# Patient Record
Sex: Female | Born: 1990 | Race: Black or African American | Hispanic: No | Marital: Married | Smoking: Former smoker
Health system: Southern US, Community
[De-identification: ages and names within clinical notes are randomized; demographics above are authoritative.]

## PROBLEM LIST (undated history)

## (undated) DIAGNOSIS — B9689 Other specified bacterial agents as the cause of diseases classified elsewhere: Secondary | ICD-10-CM

## (undated) DIAGNOSIS — J40 Bronchitis, not specified as acute or chronic: Secondary | ICD-10-CM

## (undated) HISTORY — PX: TUBAL LIGATION: SHX77

## (undated) HISTORY — PX: CHOLECYSTECTOMY: SHX55

---

## 2018-04-10 ENCOUNTER — Emergency Department (HOSPITAL_COMMUNITY): Payer: Medicare Other

## 2018-04-10 ENCOUNTER — Emergency Department (HOSPITAL_COMMUNITY)
Admission: EM | Admit: 2018-04-10 | Discharge: 2018-04-10 | Disposition: A | Discharge status: Home / Self Care | Payer: Medicare Other | Attending: Emergency Medicine | Admitting: Emergency Medicine

## 2018-04-10 ENCOUNTER — Encounter (HOSPITAL_COMMUNITY): Payer: Self-pay | Admitting: Emergency Medicine

## 2018-04-10 ENCOUNTER — Other Ambulatory Visit: Payer: Self-pay

## 2018-04-10 DIAGNOSIS — R05 Cough: Secondary | ICD-10-CM | POA: Diagnosis not present

## 2018-04-10 DIAGNOSIS — F17210 Nicotine dependence, cigarettes, uncomplicated: Secondary | ICD-10-CM | POA: Diagnosis not present

## 2018-04-10 DIAGNOSIS — R059 Cough, unspecified: Secondary | ICD-10-CM

## 2018-04-10 MED ORDER — ACETAMINOPHEN 500 MG PO TABS
1000.0000 mg | ORAL_TABLET | Freq: Once | ORAL | Status: AC
  Administered 2018-04-10: 1000 mg via ORAL
  Filled 2018-04-10: qty 2

## 2018-04-10 NOTE — ED Triage Notes (Signed)
Cough/congestion/fever/bodyaches for past few days. Daughter been sick with similar.

## 2018-04-10 NOTE — ED Provider Notes (Signed)
[facility] EMERGENCY DEPARTMENT Provider Note   CSN: [id number] Arrival date & time: 04/10/18  1749     History   Chief Complaint Chief Complaint  Patient presents with  . Cough    HPI Tina Gross is a 27 y.o. female who presents today for valuation of cough for 2 days with body aches, and chest pain.  She reports that her chest started hurting only today and she feels like that is from coughing, primarily hurts in the front.  She reports nasal congestion.  She has not had any ibuprofen or Tylenol, feels warm however denies temperatures over 100.4.  HPI  History reviewed. No pertinent past medical history.  There are no active problems to display for this patient.   Past Surgical History:  Procedure Laterality Date  . CHOLECYSTECTOMY       OB History   None      Home Medications    Prior to Admission medications   Not on File    Family History History reviewed. No pertinent family history.  Social History Social History   Tobacco Use  . Smoking status: Current Every Day Smoker    Packs/day: 0.50    Types: Cigarettes  . Smokeless tobacco: Never Used  Substance Use Topics  . Alcohol use: Never    Frequency: Never  . Drug use: Not on file     Allergies   Dilaudid [hydromorphone] and Keflex [cephalexin]   Review of Systems Review of Systems  Constitutional: Negative for chills and fever.  HENT: Positive for congestion, ear pain, postnasal drip, rhinorrhea and sore throat. Negative for sinus pressure, sinus pain, trouble swallowing and voice change.   Respiratory: Positive for cough. Negative for chest tightness and shortness of breath.   Cardiovascular: Positive for chest pain. Negative for palpitations.  Musculoskeletal: Negative for neck pain and neck stiffness.  Neurological: Negative for headaches.     Physical Exam Updated Vital Signs BP 105/67 (BP Location: Right Arm)   Pulse 86   Temp 99.2 F (37.3 C) (Oral)   Resp 20   Ht 5'  7" (1.702 m)   Wt 104.3 kg   LMP 03/29/2018   SpO2 100%   BMI 36.02 kg/m   Physical Exam  Constitutional: She appears well-developed and well-nourished. No distress.  HENT:  Head: Normocephalic and atraumatic.  Right Ear: Tympanic membrane, external ear and ear canal normal.  Left Ear: Tympanic membrane, external ear and ear canal normal.  Nose: Mucosal edema and rhinorrhea present.  Mouth/Throat: Uvula is midline, oropharynx is clear and moist and mucous membranes are normal. No oropharyngeal exudate. No tonsillar exudate.  Eyes: Conjunctivae are normal. No scleral icterus.  Neck: Normal range of motion. Neck supple.  Cardiovascular: Normal rate and regular rhythm.  Pulmonary/Chest: Effort normal and breath sounds normal. No respiratory distress. She has no wheezes. She exhibits tenderness (Anteriorly over the sternocostal joints.  Palpation in this area both recreates and exacerbates her pain.  ).  Lymphadenopathy:    She has no cervical adenopathy.  Neurological: She is alert.  Skin: Skin is warm and dry. She is not diaphoretic.  Psychiatric: She has a normal mood and affect. Her behavior is normal.  Nursing note and vitals reviewed.    ED Treatments / Results  Labs (all labs ordered are listed, but only abnormal results are displayed) Labs Reviewed - No data to display  EKG EKG Interpretation  Date/Time:  Wednesday April 10 2018 18:21:59 EDT Ventricular Rate:  93 PR  Interval:  170 QRS Duration: 74 QT Interval:  352 QTC Calculation: 437 R Axis:   69 Text Interpretation:  Normal sinus rhythm Normal ECG Confirmed by Bethann Berkshire [phone]) on 04/10/2018 6:56:42 PM   Radiology Dg Chest 2 View  Result Date: 04/10/2018 CLINICAL DATA:  Cough with fever EXAM: CHEST - 2 VIEW COMPARISON:  None. FINDINGS: Increased interstitial opacity. No focal consolidation or effusion. Normal heart size. No pneumothorax IMPRESSION: Increased interstitial opacity, possible bronchial  inflammation or viral process. No focal pneumonia. Electronically Signed   By: Jasmine Pang M.D.   On: 04/10/2018 18:52    Procedures Procedures (including critical care time)  Medications Ordered in ED Medications  acetaminophen (TYLENOL) tablet 1,000 mg (1,000 mg Oral Given 04/10/18 1847)     Initial Impression / Assessment and Plan / ED Course  I have reviewed the triage vital signs and the nursing notes.  Pertinent labs & imaging results that were available during my care of the patient were reviewed by me and considered in my medical decision making (see chart for details).    Pt CXR negative for acute infiltrate. Patients symptoms are consistent with URI, likely viral etiology. Discussed that antibiotics are not indicated for viral infections.  She did report chest pain, EKG was obtained without acute abnormalities, I suspect that her chest pain is musculoskeletal in nature as it is re-created with palpation of anterior chest wall.  Pt will be discharged with symptomatic treatment.  Verbalizes understanding and is agreeable with plan. Pt is hemodynamically stable & in NAD prior to dc.    Final Clinical Impressions(s) / ED Diagnoses   Final diagnoses:  Cough    ED Discharge Orders    None       Norman Clay 04/10/18 2304    Bethann Berkshire, MD 04/10/18 2336

## 2018-04-10 NOTE — Discharge Instructions (Signed)

## 2020-09-10 ENCOUNTER — Encounter (HOSPITAL_COMMUNITY): Payer: Self-pay

## 2020-09-10 ENCOUNTER — Other Ambulatory Visit: Payer: Self-pay

## 2020-09-10 DIAGNOSIS — Z881 Allergy status to other antibiotic agents status: Secondary | ICD-10-CM | POA: Insufficient documentation

## 2020-09-10 DIAGNOSIS — M7989 Other specified soft tissue disorders: Secondary | ICD-10-CM | POA: Insufficient documentation

## 2020-09-10 DIAGNOSIS — R0981 Nasal congestion: Secondary | ICD-10-CM | POA: Diagnosis not present

## 2020-09-10 DIAGNOSIS — R2241 Localized swelling, mass and lump, right lower limb: Secondary | ICD-10-CM | POA: Diagnosis not present

## 2020-09-10 DIAGNOSIS — J029 Acute pharyngitis, unspecified: Secondary | ICD-10-CM | POA: Insufficient documentation

## 2020-09-10 DIAGNOSIS — Z885 Allergy status to narcotic agent status: Secondary | ICD-10-CM | POA: Diagnosis not present

## 2020-09-10 DIAGNOSIS — R110 Nausea: Secondary | ICD-10-CM | POA: Diagnosis not present

## 2020-09-10 DIAGNOSIS — Z20822 Contact with and (suspected) exposure to covid-19: Secondary | ICD-10-CM | POA: Diagnosis not present

## 2020-09-10 DIAGNOSIS — R6883 Chills (without fever): Secondary | ICD-10-CM | POA: Insufficient documentation

## 2020-09-10 DIAGNOSIS — Z87891 Personal history of nicotine dependence: Secondary | ICD-10-CM | POA: Diagnosis not present

## 2020-09-10 NOTE — ED Notes (Signed)
Pt reports being sent home early from work yesterday as well for feeling nauseated with 1 episode of emesis. Pt reports not being vaccinated and not being allowed to return to work without being tested for Covid. Pt reports being able to tolerate PO fluids but endorses uneasy feeling in her stomach.

## 2020-09-10 NOTE — ED Triage Notes (Signed)
Pt reports lower right leg swelling which began last night. Pt reports pain and swelling came on suddenly and denies injury to extremity. Pt ambulatory in Triage.

## 2020-09-11 ENCOUNTER — Emergency Department (HOSPITAL_COMMUNITY)
Admission: EM | Admit: 2020-09-11 | Discharge: 2020-09-11 | Disposition: A | Discharge status: Home / Self Care | Payer: BC Managed Care – PPO | Attending: Emergency Medicine | Admitting: Emergency Medicine

## 2020-09-11 ENCOUNTER — Ambulatory Visit (HOSPITAL_COMMUNITY)
Admission: RE | Admit: 2020-09-11 | Discharge: 2020-09-11 | Disposition: A | Discharge status: Home / Self Care | Payer: BC Managed Care – PPO | Source: Ambulatory Visit | Attending: Emergency Medicine | Admitting: Emergency Medicine

## 2020-09-11 DIAGNOSIS — R0981 Nasal congestion: Secondary | ICD-10-CM

## 2020-09-11 DIAGNOSIS — M7989 Other specified soft tissue disorders: Secondary | ICD-10-CM

## 2020-09-11 DIAGNOSIS — R2241 Localized swelling, mass and lump, right lower limb: Secondary | ICD-10-CM | POA: Diagnosis not present

## 2020-09-11 DIAGNOSIS — M79604 Pain in right leg: Secondary | ICD-10-CM | POA: Insufficient documentation

## 2020-09-11 LAB — I-STAT CHEM 8, ED
BUN: 11 mg/dL (ref 6–20)
Calcium, Ion: 1.22 mmol/L (ref 1.15–1.40)
Chloride: 106 mmol/L (ref 98–111)
Creatinine, Ser: 0.7 mg/dL (ref 0.44–1.00)
Glucose, Bld: 97 mg/dL (ref 70–99)
HCT: 35 % — ABNORMAL LOW (ref 36.0–46.0)
Hemoglobin: 11.9 g/dL — ABNORMAL LOW (ref 12.0–15.0)
Potassium: 4.3 mmol/L (ref 3.5–5.1)
Sodium: 139 mmol/L (ref 135–145)
TCO2: 26 mmol/L (ref 22–32)

## 2020-09-11 LAB — I-STAT BETA HCG BLOOD, ED (MC, WL, AP ONLY): I-stat hCG, quantitative: <5 m[IU]/mL (ref <5)

## 2020-09-11 LAB — SARS CORONAVIRUS 2 (TAT 6-24 HRS): SARS Coronavirus 2: NEGATIVE

## 2020-09-11 MED ORDER — HYDROCODONE-ACETAMINOPHEN 5-325 MG PO TABS
1.0000 | ORAL_TABLET | Freq: Once | ORAL | Status: AC
  Administered 2020-09-11: 1 via ORAL
  Filled 2020-09-11: qty 1

## 2020-09-11 MED ORDER — IBUPROFEN 400 MG PO TABS
400.0000 mg | ORAL_TABLET | Freq: Once | ORAL | Status: AC
  Administered 2020-09-11: 400 mg via ORAL
  Filled 2020-09-11: qty 1

## 2020-09-11 MED ORDER — ENOXAPARIN SODIUM 120 MG/0.8ML ~~LOC~~ SOLN
1.0000 mg/kg | Freq: Once | SUBCUTANEOUS | Status: AC
  Administered 2020-09-11: 120 mg via SUBCUTANEOUS
  Filled 2020-09-11: qty 0.8

## 2020-09-11 NOTE — ED Provider Notes (Signed)
  Patient returned here today for scheduled outpatient venous ultrasound of the right lower extremity.  Ultrasound negative for evidence of DVT.  Patient notified of results.  Agrees to follow-up with PCP.    US Venous Img Lower Unilateral Right  Result Date: 09/11/2020 CLINICAL DATA:  30 year old female with a history of right leg pain EXAM: RIGHT LOWER EXTREMITY VENOUS DOPPLER ULTRASOUND TECHNIQUE: Gray-scale sonography with graded compression, as well as color Doppler and duplex ultrasound were performed to evaluate the lower extremity deep venous systems from the level of the common femoral vein and including the common femoral, femoral, profunda femoral, popliteal and calf veins including the posterior tibial, peroneal and gastrocnemius veins when visible. The superficial great saphenous vein was also interrogated. Spectral Doppler was utilized to evaluate flow at rest and with distal augmentation maneuvers in the common femoral, femoral and popliteal veins. COMPARISON:  None. FINDINGS: Contralateral Common Femoral Vein: Respiratory phasicity is normal and symmetric with the symptomatic side. No evidence of thrombus. Normal compressibility. Common Femoral Vein: No evidence of thrombus. Normal compressibility, respiratory phasicity and response to augmentation. Saphenofemoral Junction: No evidence of thrombus. Normal compressibility and flow on color Doppler imaging. Profunda Femoral Vein: No evidence of thrombus. Normal compressibility and flow on color Doppler imaging. Femoral Vein: No evidence of thrombus. Normal compressibility, respiratory phasicity and response to augmentation. Popliteal Vein: No evidence of thrombus. Normal compressibility, respiratory phasicity and response to augmentation. Calf Veins: Posterior tibial vein patent and compressible. Peroneal vein not visualized. Superficial Great Saphenous Vein: No evidence of thrombus. Normal compressibility and flow on color Doppler imaging. Other  Findings:  None. IMPRESSION: Sonographic survey of the right lower extremity negative for DVT Electronically Signed   By: Gilmer Mor D.O.   On: 09/11/2020 11:48      Pauline Aus, PA-C 09/11/20 1218    Horton, Clabe Seal, DO 09/11/20 1605

## 2020-09-11 NOTE — ED Provider Notes (Signed)
[facility] EMERGENCY DEPARTMENT Provider Note   CSN: [id number] Arrival date & time: 09/10/20  2201     History Chief Complaint  Patient presents with  . Leg Swelling    Right leg    Tina Gross is a 30 y.o. female.  HPI     Patient presents with 2 complaints.  She reports approximately 1 day ago she began having nausea, chills, congestion and sore throat.  She is also without fevers.  No cough, no chest pain or shortness of breath.  No vomiting.  No abdominal pain. She was sent home from work for evaluation  She also reports right leg pain and swelling since the previous night.  No trauma.  Hurts to walk or move the leg.  She reports it feels more swollen than the left leg.  No previous history of VTE.  She is not on contraceptives  PMH-none Soc hx - nonsmoker Past Surgical History:  Procedure Laterality Date  . CHOLECYSTECTOMY    . TUBAL LIGATION       OB History    Gravida      Para      Term      Preterm      AB      Living  4     SAB      IAB      Ectopic      Multiple      Live Births              Family History  Problem Relation Age of Onset  . Diabetes Mother   . Hypertension Mother   . Cancer Mother     Social History   Tobacco Use  . Smoking status: Former Smoker    Packs/day: 0.50    Types: Cigarettes    Quit date: 2019    Years since quitting: 3.1  . Smokeless tobacco: Never Used  Vaping Use  . Vaping Use: Never used  Substance Use Topics  . Alcohol use: Never  . Drug use: Never    Home Medications Prior to Admission medications   Not on File    Allergies    Dilaudid [hydromorphone] and Keflex [cephalexin]  Review of Systems   Review of Systems  Constitutional: Positive for chills.  HENT: Positive for congestion and sore throat.   Respiratory: Negative for shortness of breath.   Cardiovascular: Negative for chest pain.  Gastrointestinal: Positive for nausea. Negative for vomiting.  Genitourinary:  Negative for dysuria, vaginal bleeding and vaginal discharge.  All other systems reviewed and are negative.   Physical Exam Updated Vital Signs BP 112/82   Pulse 68   Temp 98.3 F (36.8 C) (Oral)   Resp 16   Ht 1.702 m (5\' 7" )   Wt 118.8 kg   LMP 08/20/2020 (Approximate)   SpO2 100%   BMI 41.04 kg/m   Physical Exam CONSTITUTIONAL: Well developed/well nourished HEAD: Normocephalic/atraumatic EYES: EOMI/PERRL ENMT: Mask in place, voice normal without stridor no dysphonia NECK: supple no meningeal signs SPINE/BACK:entire spine nontender CV: S1/S2 noted, no murmurs/rubs/gallops noted LUNGS: Lungs are clear to auscultation bilaterally, no apparent distress ABDOMEN: soft, nontender, no rebound or guarding, bowel sounds noted throughout abdomen GU:no cva tenderness NEURO: Pt is awake/alert/appropriate, moves all extremitiesx4.  No facial droop.  Patient has difficulty with range of motion of the right lower extremity due to severe pain. EXTREMITIES: pulses normal/equal, full ROM, right lower extremity appears more edematous than the left.  She has diffuse  right  calf tenderness.  She has tenderness to right  popliteal fossa without thrill.  No significant skin changes.  Distal pulses equal and intact. There is no right knee effusion noted.  Mild tenderness to the right anterior knee.  No visible trauma SKIN: warm, color normal PSYCH: no abnormalities of mood noted, alert and oriented to situation  ED Results / Procedures / Treatments   Labs (all labs ordered are listed, but only abnormal results are displayed) Labs Reviewed  I-STAT CHEM 8, ED - Abnormal; Notable for the following components:      Result Value   Hemoglobin 11.9 (*)    HCT 35.0 (*)    All other components within normal limits  SARS CORONAVIRUS 2 (TAT 6-24 HRS)  I-STAT BETA HCG BLOOD, ED ([facility], [facility], [facility] ONLY)    EKG None  Radiology No results found.  Procedures Procedures   Medications Ordered in  ED Medications  enoxaparin (LOVENOX) injection 120 mg (has no administration in time range)  HYDROcodone-acetaminophen (NORCO/VICODIN) 5-325 MG per tablet 1 tablet (1 tablet Oral Given 09/11/20 0220)  ibuprofen (ADVIL) tablet 400 mg (400 mg Oral Given 09/11/20 0220)    ED Course  I have reviewed the triage vital signs and the nursing notes.  Pertinent labs  results that were available during my care of the patient were reviewed by me and considered in my medical decision making (see chart for details).    MDM Rules/Calculators/A&P                          Patient presented for 2 complaints.  She reports congestion and sore throat.  No distress is noted.  Will send Covid swab  Patient also reports right lower extremity edema and tenderness.  No joint effusion.  No signs of cellulitis.  She is neuro vascularly intact, but range of motion is limited due to pain in the leg.  No crepitus.  No bruising.  She does have asymmetric legs.  Will give dose of Lovenox and have her return in the morning for DVT study Final Clinical Impression(s) / ED Diagnoses Final diagnoses:  Right leg swelling  Nasal congestion    Rx / DC Orders ED Discharge Orders         Ordered    US Venous Img Lower Unilateral Right        09/11/20 0200           Zadie Rhine, MD 09/11/20 [phone]

## 2020-11-17 ENCOUNTER — Encounter (HOSPITAL_COMMUNITY): Payer: Self-pay | Admitting: Emergency Medicine

## 2020-11-17 ENCOUNTER — Emergency Department (HOSPITAL_COMMUNITY)
Admission: EM | Admit: 2020-11-17 | Discharge: 2020-11-18 | Disposition: A | Discharge status: Home / Self Care | Payer: BC Managed Care – PPO | Attending: Emergency Medicine | Admitting: Emergency Medicine

## 2020-11-17 ENCOUNTER — Other Ambulatory Visit: Payer: Self-pay

## 2020-11-17 DIAGNOSIS — M79672 Pain in left foot: Secondary | ICD-10-CM | POA: Diagnosis present

## 2020-11-17 DIAGNOSIS — Z87891 Personal history of nicotine dependence: Secondary | ICD-10-CM | POA: Diagnosis not present

## 2020-11-17 DIAGNOSIS — M722 Plantar fascial fibromatosis: Secondary | ICD-10-CM | POA: Insufficient documentation

## 2020-11-17 NOTE — ED Triage Notes (Signed)
Pt c/o continued foot pain for the past 3 years. Pt states she was seen by a doctor and told that she has bone spurs. Pt states when she got up tonight for work her left foot was hurting too bad to go to work.

## 2020-11-18 DIAGNOSIS — M722 Plantar fascial fibromatosis: Secondary | ICD-10-CM | POA: Diagnosis not present

## 2020-11-18 MED ORDER — IBUPROFEN 400 MG PO TABS
400.0000 mg | ORAL_TABLET | Freq: Once | ORAL | Status: AC
  Administered 2020-11-18: 400 mg via ORAL
  Filled 2020-11-18: qty 1

## 2020-11-18 MED ORDER — MELOXICAM 15 MG PO TABS: 15.0000 mg | ORAL_TABLET | Freq: Every day | ORAL | 0 refills | Status: AC

## 2020-11-18 NOTE — ED Provider Notes (Signed)
[facility] EMERGENCY DEPARTMENT Provider Note   CSN: [id number] Arrival date & time: 11/17/20  2321     History Chief Complaint  Patient presents with  . Foot Pain    Tina Gross is a 30 y.o. female.  The history is provided by the patient.  Foot Pain This is a new problem. The current episode started more than 1 week ago. The problem occurs daily. The problem has been gradually worsening. The symptoms are aggravated by walking. The symptoms are relieved by rest.   Patient reports long history of left foot pain.  She reports over the past day it has been getting worse.  She reports working all night on her feet, and after sleeping for a while she wakes up and has intense pain on the plantar surface of her left foot.  At times that she does have pain into her leg, but most of the pain is in the foot  no recent trauma.     PMH-none Past Surgical History:  Procedure Laterality Date  . CHOLECYSTECTOMY    . TUBAL LIGATION       OB History    Gravida      Para      Term      Preterm      AB      Living  4     SAB      IAB      Ectopic      Multiple      Live Births              Family History  Problem Relation Age of Onset  . Diabetes Mother   . Hypertension Mother   . Cancer Mother     Social History   Tobacco Use  . Smoking status: Former Smoker    Packs/day: 0.50    Types: Cigarettes    Quit date: 2019    Years since quitting: 3.3  . Smokeless tobacco: Never Used  Vaping Use  . Vaping Use: Never used  Substance Use Topics  . Alcohol use: Never  . Drug use: Never    Home Medications Prior to Admission medications   Medication Sig Start Date End Date Taking? Authorizing Provider  meloxicam (MOBIC) 15 MG tablet Take 1 tablet (15 mg total) by mouth daily. 11/18/20  Yes Zadie Rhine, MD    Allergies    Dilaudid [hydromorphone] and Keflex [cephalexin]  Review of Systems   Review of Systems  Cardiovascular: Negative for leg  swelling.  Musculoskeletal: Positive for arthralgias.    Physical Exam Updated Vital Signs BP 138/76   Pulse 70   Temp 97.8 F (36.6 C) (Oral)   Resp 18   Ht 1.702 m (5\' 7" )   Wt 125.2 kg   SpO2 100%   BMI 43.23 kg/m   Physical Exam CONSTITUTIONAL: Well developed/well nourished HEAD: Normocephalic/atraumatic EYES: EOMI ENMT: Mucous membranes moist NECK: supple no meningeal signs LUNGS:  no apparent distress NEURO: Pt is awake/alert/appropriate, moves all extremitiesx4.  No facial droop.  Appropriate strength noted in left foot, no sensory deficit EXTREMITIES: pulses normal/equal, full ROM, no erythema or swelling noted left foot.  No deformities.  No puncture wounds on the plantar surface of the foot.  Minimal tenderness to palpation of left foot.  Distal pulses intact. SKIN: warm, color normal PSYCH: no abnormalities of mood noted, alert and oriented to situation  ED Results / Procedures / Treatments   Labs (all labs ordered are listed,  but only abnormal results are displayed) Labs Reviewed - No data to display  EKG None  Radiology No results found.  Procedures Procedures   Medications Ordered in ED Medications  ibuprofen (ADVIL) tablet 400 mg (400 mg Oral Given 11/18/20 0055)    ED Course  I have reviewed the triage vital signs and the nursing notes.     MDM Rules/Calculators/A&P                          No indication for imaging.  Likely plantar fasciitis.  Placed in postop shoe, meloxicam for 10 days and referral to podiatry Final Clinical Impression(s) / ED Diagnoses Final diagnoses:  Plantar fasciitis    Rx / DC Orders ED Discharge Orders         Ordered    meloxicam (MOBIC) 15 MG tablet  Daily        11/18/20 0039           Zadie Rhine, MD 11/18/20 [phone]

## 2020-12-04 ENCOUNTER — Emergency Department (HOSPITAL_COMMUNITY)
Admission: EM | Admit: 2020-12-04 | Discharge: 2020-12-04 | Disposition: A | Discharge status: Home / Self Care | Payer: BC Managed Care – PPO | Attending: Emergency Medicine | Admitting: Emergency Medicine

## 2020-12-04 ENCOUNTER — Other Ambulatory Visit: Payer: Self-pay

## 2020-12-04 ENCOUNTER — Encounter (HOSPITAL_COMMUNITY): Payer: Self-pay | Admitting: *Deleted

## 2020-12-04 ENCOUNTER — Emergency Department (HOSPITAL_COMMUNITY): Payer: BC Managed Care – PPO

## 2020-12-04 DIAGNOSIS — Z87891 Personal history of nicotine dependence: Secondary | ICD-10-CM | POA: Diagnosis not present

## 2020-12-04 DIAGNOSIS — H65191 Other acute nonsuppurative otitis media, right ear: Secondary | ICD-10-CM | POA: Diagnosis not present

## 2020-12-04 DIAGNOSIS — R5383 Other fatigue: Secondary | ICD-10-CM | POA: Diagnosis not present

## 2020-12-04 DIAGNOSIS — R42 Dizziness and giddiness: Secondary | ICD-10-CM | POA: Diagnosis not present

## 2020-12-04 DIAGNOSIS — Z20822 Contact with and (suspected) exposure to covid-19: Secondary | ICD-10-CM | POA: Insufficient documentation

## 2020-12-04 DIAGNOSIS — N3000 Acute cystitis without hematuria: Secondary | ICD-10-CM | POA: Diagnosis not present

## 2020-12-04 DIAGNOSIS — H9201 Otalgia, right ear: Secondary | ICD-10-CM | POA: Diagnosis present

## 2020-12-04 LAB — URINALYSIS, ROUTINE W REFLEX MICROSCOPIC
Bacteria, UA: NONE SEEN
Bilirubin Urine: NEGATIVE
Glucose, UA: NEGATIVE mg/dL
Hgb urine dipstick: NEGATIVE
Ketones, ur: NEGATIVE mg/dL
Leukocytes,Ua: NEGATIVE
Nitrite: POSITIVE — AB
Protein, ur: NEGATIVE mg/dL
Specific Gravity, Urine: 1.029 (ref 1.005–1.030)
pH: 5 (ref 5.0–8.0)

## 2020-12-04 LAB — CBC WITH DIFFERENTIAL/PLATELET
Abs Immature Granulocytes: 0.01 10*3/uL (ref 0.00–0.07)
Basophils Absolute: 0 10*3/uL (ref 0.0–0.1)
Basophils Relative: 0 %
Eosinophils Absolute: 0.1 10*3/uL (ref 0.0–0.5)
Eosinophils Relative: 2 %
HCT: 36.4 % (ref 36.0–46.0)
Hemoglobin: 11.6 g/dL — ABNORMAL LOW (ref 12.0–15.0)
Immature Granulocytes: 0 %
Lymphocytes Relative: 40 %
Lymphs Abs: 1.9 10*3/uL (ref 0.7–4.0)
MCH: 31.4 pg (ref 26.0–34.0)
MCHC: 31.9 g/dL (ref 30.0–36.0)
MCV: 98.6 fL (ref 80.0–100.0)
Monocytes Absolute: 0.4 10*3/uL (ref 0.1–1.0)
Monocytes Relative: 8 %
Neutro Abs: 2.4 10*3/uL (ref 1.7–7.7)
Neutrophils Relative %: 50 %
Platelets: 245 10*3/uL (ref 150–400)
RBC: 3.69 MIL/uL — ABNORMAL LOW (ref 3.87–5.11)
RDW: 13.2 % (ref 11.5–15.5)
WBC: 4.8 10*3/uL (ref 4.0–10.5)
nRBC: 0 % (ref 0.0–0.2)

## 2020-12-04 LAB — RESP PANEL BY RT-PCR (FLU A&B, COVID) ARPGX2
Influenza A by PCR: NEGATIVE
Influenza B by PCR: NEGATIVE
SARS Coronavirus 2 by RT PCR: NEGATIVE

## 2020-12-04 LAB — COMPREHENSIVE METABOLIC PANEL
ALT: 19 U/L (ref 0–44)
AST: 21 U/L (ref 15–41)
Albumin: 3.7 g/dL (ref 3.5–5.0)
Alkaline Phosphatase: 70 U/L (ref 38–126)
Anion gap: 6 (ref 5–15)
BUN: 13 mg/dL (ref 6–20)
CO2: 27 mmol/L (ref 22–32)
Calcium: 8.7 mg/dL — ABNORMAL LOW (ref 8.9–10.3)
Chloride: 104 mmol/L (ref 98–111)
Creatinine, Ser: 0.7 mg/dL (ref 0.44–1.00)
GFR, Estimated: >60 mL/min (ref >60)
Glucose, Bld: 97 mg/dL (ref 70–99)
Potassium: 3.7 mmol/L (ref 3.5–5.1)
Sodium: 137 mmol/L (ref 135–145)
Total Bilirubin: 0.5 mg/dL (ref 0.3–1.2)
Total Protein: 6.5 g/dL (ref 6.5–8.1)

## 2020-12-04 LAB — TSH: TSH: 1.067 u[IU]/mL (ref 0.350–4.500)

## 2020-12-04 LAB — PREGNANCY, URINE: Preg Test, Ur: NEGATIVE

## 2020-12-04 MED ORDER — DIPHENHYDRAMINE HCL 50 MG/ML IJ SOLN
25.0000 mg | Freq: Once | INTRAMUSCULAR | Status: AC
  Administered 2020-12-04: 25 mg via INTRAVENOUS
  Filled 2020-12-04: qty 1

## 2020-12-04 MED ORDER — CIPROFLOXACIN HCL 500 MG PO TABS: 500.0000 mg | ORAL_TABLET | Freq: Two times a day (BID) | ORAL | 0 refills | Status: DC

## 2020-12-04 MED ORDER — DIPHENHYDRAMINE HCL 25 MG PO TABS: 25.0000 mg | ORAL_TABLET | Freq: Four times a day (QID) | ORAL | 0 refills | Status: AC | PRN

## 2020-12-04 MED ORDER — CIPROFLOXACIN IN D5W 400 MG/200ML IV SOLN
400.0000 mg | Freq: Once | INTRAVENOUS | Status: AC
  Administered 2020-12-04: 400 mg via INTRAVENOUS
  Filled 2020-12-04: qty 200

## 2020-12-04 MED ORDER — AZITHROMYCIN 250 MG PO TABS: 250.0000 mg | ORAL_TABLET | Freq: Every day | ORAL | 0 refills | Status: AC

## 2020-12-04 NOTE — Discharge Instructions (Addendum)
Questionable urinary tract infection.  Does appear that she have a right-sided ear infection.  Rest of work-up without any findings.  Pregnancy test negative.  Take the antibiotics Cipro as directed.  Return for any new or worse symptoms.  Hopefully there will be improvement over the next couple days.  COVID testing and influenza testing negative.  Addendum: Do not take the Cipro that was sent to your pharmacy.  Instead start the azithromycin we will wait for the urine culture to determine whether there is a urinary tract infection or not.  The azithromycin is for the ear infection.  Take the Benadryl as needed for itching.  Return for any new or worse symptoms.

## 2020-12-04 NOTE — ED Notes (Signed)
Pt called out for hives and itching. Pt having redness and hives around IV site. Cipro IV stopped and line flushed. MD notified.

## 2020-12-04 NOTE — ED Triage Notes (Signed)
C/o lightheadedness for the past 2 days

## 2020-12-04 NOTE — ED Provider Notes (Signed)
[facility] EMERGENCY DEPARTMENT Provider Note   CSN: [id number] Arrival date & time: 12/04/20  1447     History No chief complaint on file.   Tina Gross is a 30 y.o. female.  Patient with significant fatigue starting on Thursday.  Lightheadedness no real dizziness or room spinning.  And right ear pain.  No abdominal pain no chest pain no shortness of breath.  No fevers.  Not hurting to urinate.  No diarrhea.  No nausea or vomiting.  No fever.        History reviewed. No pertinent past medical history.  There are no problems to display for this patient.   Past Surgical History:  Procedure Laterality Date  . CHOLECYSTECTOMY    . TUBAL LIGATION       OB History    Gravida      Para      Term      Preterm      AB      Living  4     SAB      IAB      Ectopic      Multiple      Live Births              Family History  Problem Relation Age of Onset  . Diabetes Mother   . Hypertension Mother   . Cancer Mother     Social History   Tobacco Use  . Smoking status: Former Smoker    Packs/day: 0.50    Types: Cigarettes    Quit date: 2019    Years since quitting: 3.3  . Smokeless tobacco: Never Used  Vaping Use  . Vaping Use: Never used  Substance Use Topics  . Alcohol use: Never  . Drug use: Never    Home Medications Prior to Admission medications   Medication Sig Start Date End Date Taking? Authorizing Provider  ciprofloxacin (CIPRO) 500 MG tablet Take 1 tablet (500 mg total) by mouth 2 (two) times daily. 12/04/20  Yes Vanetta Mulders, MD  cyclobenzaprine (FLEXERIL) 5 MG tablet Take by mouth. 01/15/18  Yes [provider]  meloxicam (MOBIC) 15 MG tablet Take 1 tablet (15 mg total) by mouth daily. 11/18/20   Zadie Rhine, MD  omeprazole (PRILOSEC) 40 MG capsule Take 1 capsule by mouth daily. 10/18/20   [provider]    Allergies    Dilaudid [hydromorphone] and Keflex [cephalexin]  Review of Systems   Review  of Systems  Constitutional: Positive for fatigue. Negative for chills and fever.  HENT: Positive for ear pain. Negative for congestion, rhinorrhea and sore throat.   Eyes: Negative for visual disturbance.  Respiratory: Negative for cough and shortness of breath.   Cardiovascular: Negative for chest pain and leg swelling.  Gastrointestinal: Negative for abdominal pain, diarrhea, nausea and vomiting.  Genitourinary: Negative for dysuria.  Musculoskeletal: Negative for back pain and neck pain.  Skin: Negative for rash.  Neurological: Positive for light-headedness. Negative for dizziness and headaches.  Hematological: Does not bruise/bleed easily.  Psychiatric/Behavioral: Negative for confusion.    Physical Exam Updated Vital Signs BP 112/79   Pulse 75   Temp 98.4 F (36.9 C) (Oral)   Resp 16   Ht 1.651 m (5\' 5" )   Wt 125.2 kg   SpO2 98%   BMI 45.93 kg/m   Physical Exam Vitals and nursing note reviewed.  Constitutional:      General: She is not in acute distress.    Appearance:  Normal appearance. She is well-developed.  HENT:     Head: Normocephalic and atraumatic.     Mouth/Throat:     Mouth: Mucous membranes are moist.  Eyes:     Extraocular Movements: Extraocular movements intact.     Conjunctiva/sclera: Conjunctivae normal.     Pupils: Pupils are equal, round, and reactive to light.  Cardiovascular:     Rate and Rhythm: Normal rate and regular rhythm.     Heart sounds: No murmur heard.   Pulmonary:     Effort: Pulmonary effort is normal. No respiratory distress.     Breath sounds: Normal breath sounds.  Abdominal:     Palpations: Abdomen is soft.     Tenderness: There is no abdominal tenderness.  Musculoskeletal:        General: Normal range of motion.     Cervical back: Normal range of motion and neck supple. No rigidity.  Skin:    General: Skin is warm and dry.     Capillary Refill: Capillary refill takes less than 2 seconds.     Findings: No rash.   Neurological:     General: No focal deficit present.     Mental Status: She is alert and oriented to person, place, and time.     Cranial Nerves: No cranial nerve deficit.     Sensory: No sensory deficit.     Motor: No weakness.     ED Results / Procedures / Treatments   Labs (all labs ordered are listed, but only abnormal results are displayed) Labs Reviewed  COMPREHENSIVE METABOLIC PANEL - Abnormal; Notable for the following components:      Result Value   Calcium 8.7 (*)    All other components within normal limits  CBC WITH DIFFERENTIAL/PLATELET - Abnormal; Notable for the following components:   RBC 3.69 (*)    Hemoglobin 11.6 (*)    All other components within normal limits  URINALYSIS, ROUTINE W REFLEX MICROSCOPIC - Abnormal; Notable for the following components:   Color, Urine AMBER (*)    Nitrite POSITIVE (*)    All other components within normal limits  RESP PANEL BY RT-PCR (FLU A&B, COVID) ARPGX2  URINE CULTURE  PREGNANCY, URINE  TSH    EKG EKG Interpretation  Date/Time:  Saturday Dec 04 2020 15:51:45 EDT Ventricular Rate:  79 PR Interval:  190 QRS Duration: 91 QT Interval:  368 QTC Calculation: 422 R Axis:   57 Text Interpretation: Sinus rhythm Consider left atrial enlargement Low voltage, precordial leads Confirmed by Vanetta Mulders [phone]) on 12/04/2020 4:04:58 PM   Radiology DG Chest Port 1 View  Result Date: 12/04/2020 CLINICAL DATA:  Patient complaining of lightheadedness for 2 days. EXAM: PORTABLE CHEST 1 VIEW COMPARISON:  Chest radiograph 09/10/2017 FINDINGS: The cardiomediastinal contours are within normal limits. No new focal opacity. The chronic coarsening of the interstitium likely related to smoking history. No pneumothorax or pleural effusion. No acute finding in the visualized skeleton. IMPRESSION: No acute cardiopulmonary process. Electronically Signed   By: Emmaline Kluver M.D.   On: 12/04/2020 16:14    Procedures Procedures    Medications Ordered in ED Medications  ciprofloxacin (CIPRO) IVPB 400 mg (has no administration in time range)    ED Course  I have reviewed the triage vital signs and the nursing notes.  Pertinent labs & imaging results that were available during my care of the patient were reviewed by me and considered in my medical decision making (see chart for details).  MDM Rules/Calculators/A&P                          Work-up without any significant findings other than positive nitrite with urinalysis.  But the urine cell count RBCs may be 6-10 white blood cells 0-5 but no bacteria.  Sent for culture.  We will go ahead and treat with Cipro since patient has a Keflex allergy.  Also that does appear on exam to be a right otitis media.  Rest of work-up pregnancy test negative labs without any significant abnormalities.  Chest x-ray negative.  TSH showing normal thyroid function.  And COVID and influenza testing negative.  Will have patient take Cipro for the next 7 days.  Will give first dose here today since patient's pharmacy is currently closed.  She will return for any new or worse symptoms.  Hopefully will be improving over the next couple days.  Tums could just be a viral type illness as well.  Patient nontoxic no acute distress.  Oxygen saturation 100% here.  Temp 97.8.     Final Clinical Impression(s) / ED Diagnoses Final diagnoses:  Fatigue, unspecified type  Acute cystitis without hematuria  Other non-recurrent acute nonsuppurative otitis media of right ear    Rx / DC Orders ED Discharge Orders         Ordered    ciprofloxacin (CIPRO) 500 MG tablet  2 times daily        12/04/20 1849           Vanetta Mulders, MD 12/04/20 1853

## 2020-12-06 LAB — URINE CULTURE: Culture: <10000 — AB

## 2021-03-10 ENCOUNTER — Other Ambulatory Visit: Payer: Self-pay

## 2021-03-10 ENCOUNTER — Encounter (HOSPITAL_COMMUNITY): Payer: Self-pay | Admitting: *Deleted

## 2021-03-10 ENCOUNTER — Emergency Department (HOSPITAL_COMMUNITY)
Admission: EM | Admit: 2021-03-10 | Discharge: 2021-03-10 | Disposition: A | Discharge status: Home / Self Care | Payer: Medicare Other | Attending: Emergency Medicine | Admitting: Emergency Medicine

## 2021-03-10 DIAGNOSIS — Z87891 Personal history of nicotine dependence: Secondary | ICD-10-CM | POA: Diagnosis not present

## 2021-03-10 DIAGNOSIS — H6123 Impacted cerumen, bilateral: Secondary | ICD-10-CM | POA: Insufficient documentation

## 2021-03-10 DIAGNOSIS — Z28310 Unvaccinated for covid-19: Secondary | ICD-10-CM | POA: Insufficient documentation

## 2021-03-10 DIAGNOSIS — J069 Acute upper respiratory infection, unspecified: Secondary | ICD-10-CM | POA: Diagnosis not present

## 2021-03-10 DIAGNOSIS — Z20822 Contact with and (suspected) exposure to covid-19: Secondary | ICD-10-CM | POA: Diagnosis not present

## 2021-03-10 DIAGNOSIS — J029 Acute pharyngitis, unspecified: Secondary | ICD-10-CM | POA: Diagnosis present

## 2021-03-10 LAB — RESP PANEL BY RT-PCR (FLU A&B, COVID) ARPGX2
Influenza A by PCR: NEGATIVE
Influenza B by PCR: NEGATIVE
SARS Coronavirus 2 by RT PCR: NEGATIVE

## 2021-03-10 LAB — GROUP A STREP BY PCR: Group A Strep by PCR: NOT DETECTED

## 2021-03-10 NOTE — ED Triage Notes (Signed)
Sore throat x 3 days

## 2021-03-10 NOTE — Discharge Instructions (Addendum)
Home to quarantine pending your COVID test results which should be available in the next hour in your MyChart account. You may manage symptoms with over-the-counter medications such as NyQuil or DayQuil, Zyrtec, Flonase, Sudafed.  Recheck with your primary care provider if not improving.

## 2021-03-10 NOTE — ED Provider Notes (Signed)
[facility] EMERGENCY DEPARTMENT Provider Note   CSN: [id number] Arrival date & time: 03/10/21  1422     History Chief Complaint  Patient presents with   Sore Throat    Tina Gross is a 30 y.o. female.  30 year old female presents with complaint of sore throat, cough, body aches and ear pain x5 days.  Not vaccinated against COVID, no known COVID exposure.  Denies fevers.  No other complaints or concerns today.      History reviewed. No pertinent past medical history.  There are no problems to display for this patient.   Past Surgical History:  Procedure Laterality Date   CHOLECYSTECTOMY     TUBAL LIGATION       OB History     Gravida      Para      Term      Preterm      AB      Living  4      SAB      IAB      Ectopic      Multiple      Live Births              Family History  Problem Relation Age of Onset   Diabetes Mother    Hypertension Mother    Cancer Mother     Social History   Tobacco Use   Smoking status: Former    Packs/day: 0.50    Types: Cigarettes    Quit date: 2019    Years since quitting: 3.6   Smokeless tobacco: Never  Vaping Use   Vaping Use: Never used  Substance Use Topics   Alcohol use: Never   Drug use: Never    Home Medications Prior to Admission medications   Medication Sig Start Date End Date Taking? Authorizing Provider  azithromycin (ZITHROMAX) 250 MG tablet Take 1 tablet (250 mg total) by mouth daily. Take first 2 tablets together, then 1 every day until finished. 12/04/20   Vanetta Mulders, MD  cyclobenzaprine (FLEXERIL) 5 MG tablet Take by mouth. 01/15/18   [provider]  diphenhydrAMINE (BENADRYL) 25 MG tablet Take 1 tablet (25 mg total) by mouth every 6 (six) hours as needed for itching. 12/04/20   Vanetta Mulders, MD  meloxicam (MOBIC) 15 MG tablet Take 1 tablet (15 mg total) by mouth daily. 11/18/20   Zadie Rhine, MD  omeprazole (PRILOSEC) 40 MG capsule Take 1 capsule by  mouth daily. 10/18/20   [provider]    Allergies    Dilaudid [hydromorphone], Ciprofloxacin, and Keflex [cephalexin]  Review of Systems   Review of Systems  Constitutional:  Negative for chills and fever.  HENT:  Positive for ear pain and sore throat.   Respiratory:  Positive for cough.   Gastrointestinal:  Negative for nausea and vomiting.  Musculoskeletal:  Positive for arthralgias and myalgias.  Skin:  Negative for rash and wound.  Allergic/Immunologic: Negative for immunocompromised state.  Neurological:  Negative for weakness.  Hematological:  Negative for adenopathy.  Psychiatric/Behavioral:  Negative for confusion.   All other systems reviewed and are negative.  Physical Exam Updated Vital Signs BP (!) 96/59   Pulse 87   Temp 99.1 F (37.3 C) (Oral)   Resp 20   Ht 5\' 7"  (1.702 m)   Wt 122.5 kg   LMP 01/23/2021   SpO2 100%   BMI 42.29 kg/m   Physical Exam Vitals and nursing note reviewed.  Constitutional:  General: She is not in acute distress.    Appearance: She is well-developed. She is not diaphoretic.  HENT:     Head: Normocephalic and atraumatic.     Right Ear: There is impacted cerumen.     Left Ear: There is impacted cerumen.     Nose: No congestion.     Mouth/Throat:     Mouth: Mucous membranes are moist.     Pharynx: Uvula midline. No pharyngeal swelling or oropharyngeal exudate.     Tonsils: No tonsillar exudate. 1+ on the right. 1+ on the left.  Eyes:     Conjunctiva/sclera: Conjunctivae normal.  Cardiovascular:     Rate and Rhythm: Normal rate and regular rhythm.     Heart sounds: Normal heart sounds.  Pulmonary:     Effort: Pulmonary effort is normal.  Skin:    General: Skin is warm and dry.  Neurological:     Mental Status: She is alert and oriented to person, place, and time.  Psychiatric:        Behavior: Behavior normal.    ED Results / Procedures / Treatments   Labs (all labs ordered are listed, but only abnormal  results are displayed) Labs Reviewed  GROUP A STREP BY PCR  RESP PANEL BY RT-PCR (FLU A&B, COVID) ARPGX2    EKG None  Radiology No results found.  Procedures Procedures   Medications Ordered in ED Medications - No data to display  ED Course  I have reviewed the triage vital signs and the nursing notes.  Pertinent labs & imaging results that were available during my care of the patient were reviewed by me and considered in my medical decision making (see chart for details).  Clinical Course as of 03/10/21 1812  Thu Mar 10, 2021  [id number] 30 year old female with complaint of sore throat and body aches as above.  Exam without significant findings.  Rapid strep test is negative.  COVID is pending, may be discharged to follow-up with results in MyChart.  Room air O2 sat 100%.  Recommend symptomatic treatment and recheck with PCP if not improving. [LM]    Clinical Course User Index [LM] Alden Hipp   MDM Rules/Calculators/A&P                           Final Clinical Impression(s) / ED Diagnoses Final diagnoses:  Viral upper respiratory tract infection    Rx / DC Orders ED Discharge Orders     None        Jeannie Fend, PA-C 03/10/21 1812    Terald Sleeper, MD 03/11/21 [phone]

## 2021-04-03 ENCOUNTER — Other Ambulatory Visit: Payer: Self-pay

## 2021-04-03 ENCOUNTER — Encounter (HOSPITAL_COMMUNITY): Payer: Self-pay | Admitting: Emergency Medicine

## 2021-04-03 ENCOUNTER — Emergency Department (HOSPITAL_COMMUNITY)
Admission: EM | Admit: 2021-04-03 | Discharge: 2021-04-03 | Disposition: A | Discharge status: Home / Self Care | Payer: Medicare Other | Attending: Emergency Medicine | Admitting: Emergency Medicine

## 2021-04-03 DIAGNOSIS — Z1152 Encounter for screening for COVID-19: Secondary | ICD-10-CM | POA: Diagnosis present

## 2021-04-03 DIAGNOSIS — Z20822 Contact with and (suspected) exposure to covid-19: Secondary | ICD-10-CM | POA: Diagnosis not present

## 2021-04-03 DIAGNOSIS — Z87891 Personal history of nicotine dependence: Secondary | ICD-10-CM | POA: Diagnosis not present

## 2021-04-03 NOTE — ED Triage Notes (Signed)
Pt here for COVID test. Son has COVID. Denies s/s.

## 2021-04-03 NOTE — Discharge Instructions (Addendum)
Follow-up with your doctor for any further testing needs. Continue to mask, sanitize, limit exposure as best as possible.

## 2021-04-03 NOTE — ED Provider Notes (Signed)
[facility] EMERGENCY DEPARTMENT Provider Note   CSN: [id number] Arrival date & time: 04/03/21  1620     History Chief Complaint  Patient presents with   Covid Exposure    Tina Gross is a 30 y.o. female.  30 year old female presents with family requesting COVID test after exposure to her 71-month-old 4 days ago who has tested positive for COVID.  Patient was negative at that time.  Reports history of occasional bronchitis otherwise healthy.      History reviewed. No pertinent past medical history.  There are no problems to display for this patient.   Past Surgical History:  Procedure Laterality Date   CHOLECYSTECTOMY     TUBAL LIGATION       OB History     Gravida      Para      Term      Preterm      AB      Living  4      SAB      IAB      Ectopic      Multiple      Live Births              Family History  Problem Relation Age of Onset   Diabetes Mother    Hypertension Mother    Cancer Mother     Social History   Tobacco Use   Smoking status: Former    Packs/day: 0.50    Types: Cigarettes    Quit date: 2019    Years since quitting: 3.6   Smokeless tobacco: Never  Vaping Use   Vaping Use: Never used  Substance Use Topics   Alcohol use: Never   Drug use: Never    Home Medications Prior to Admission medications   Medication Sig Start Date End Date Taking? Authorizing Provider  azithromycin (ZITHROMAX) 250 MG tablet Take 1 tablet (250 mg total) by mouth daily. Take first 2 tablets together, then 1 every day until finished. 12/04/20   Vanetta Mulders, MD  cyclobenzaprine (FLEXERIL) 5 MG tablet Take by mouth. 01/15/18   [provider]  diphenhydrAMINE (BENADRYL) 25 MG tablet Take 1 tablet (25 mg total) by mouth every 6 (six) hours as needed for itching. 12/04/20   Vanetta Mulders, MD  meloxicam (MOBIC) 15 MG tablet Take 1 tablet (15 mg total) by mouth daily. 11/18/20   Zadie Rhine, MD  omeprazole (PRILOSEC) 40  MG capsule Take 1 capsule by mouth daily. 10/18/20   [provider]    Allergies    Dilaudid [hydromorphone], Ciprofloxacin, and Keflex [cephalexin]  Review of Systems   Review of Systems  Constitutional:  Negative for fever.  HENT:  Negative for sore throat.   Respiratory:  Negative for cough.   Musculoskeletal:  Negative for back pain and myalgias.  Skin:  Negative for wound.  Allergic/Immunologic: Negative for immunocompromised state.  All other systems reviewed and are negative.  Physical Exam Updated Vital Signs BP 124/72 (BP Location: Right Arm)   Pulse 83   Temp 98.3 F (36.8 C) (Oral)   Resp 18   Ht 5\' 7"  (1.702 m)   Wt 120.2 kg   LMP 03/27/2021 (Approximate)   SpO2 99%   BMI 41.50 kg/m   Physical Exam Vitals and nursing note reviewed.  Constitutional:      General: She is not in acute distress.    Appearance: She is well-developed. She is not diaphoretic.  HENT:     Head:  Normocephalic and atraumatic.  Cardiovascular:     Rate and Rhythm: Normal rate and regular rhythm.     Heart sounds: Normal heart sounds.  Pulmonary:     Effort: Pulmonary effort is normal.     Breath sounds: Normal breath sounds.  Musculoskeletal:     Right lower leg: No edema.     Left lower leg: No edema.  Skin:    General: Skin is warm and dry.     Findings: No erythema or rash.  Neurological:     Mental Status: She is alert and oriented to person, place, and time.  Psychiatric:        Behavior: Behavior normal.    ED Results / Procedures / Treatments   Labs (all labs ordered are listed, but only abnormal results are displayed) Labs Reviewed  SARS CORONAVIRUS 2 (TAT 6-24 HRS)  RESP PANEL BY RT-PCR (FLU A&B, COVID) ARPGX2    EKG None  Radiology No results found.  Procedures Procedures   Medications Ordered in ED Medications - No data to display  ED Course  I have reviewed the triage vital signs and the nursing notes.  Pertinent labs & imaging results  that were available during my care of the patient were reviewed by me and considered in my medical decision making (see chart for details).  Clinical Course as of 04/03/21 1707  Sun Apr 03, 2021  1706 Otherwise healthy female with COVID exposure requesting testing.  Explained to patient testing is not necessary at this time, she should continue to mask, sanitize, distance as best as possible.  Test sent out, results available in MyChart.  Understands that she is masked until day 20 (her day 1 starts when the 21mo's 10 day Q ends) assuming no one else in the home gets sick.  [LM]    Clinical Course User Index [LM] Alden Hipp   MDM Rules/Calculators/A&P                           Final Clinical Impression(s) / ED Diagnoses Final diagnoses:  Close exposure to COVID-19 virus    Rx / DC Orders ED Discharge Orders     None        Jeannie Fend, PA-C 04/03/21 1707    Pricilla Loveless, MD 04/04/21 [phone]

## 2021-04-04 LAB — SARS CORONAVIRUS 2 (TAT 6-24 HRS): SARS Coronavirus 2: NEGATIVE

## 2022-09-02 IMAGING — US US EXTREM LOW VENOUS*R*
1 series · 13 of 24 positions shown · non-contrast
Comparison: None.

CLINICAL DATA: 29-year-old female with a history of right leg pain



[Series 1: us venous img lower uni right (dvt) · portal-venous · 13 of 36 slices shown]
[im 1/36]
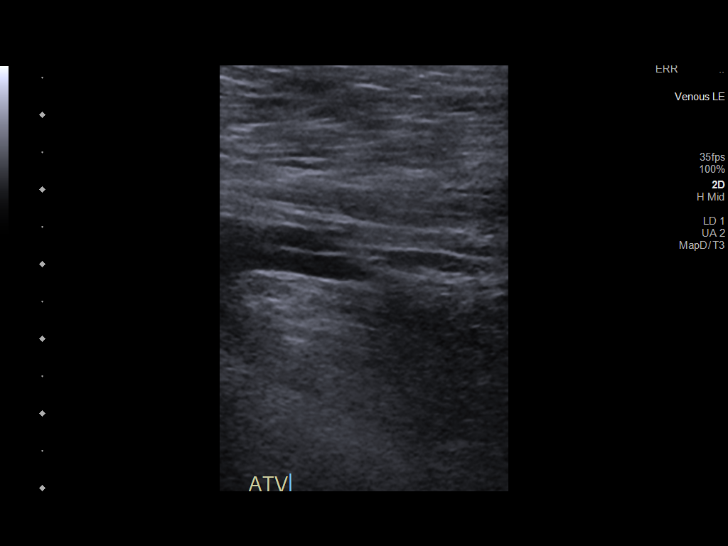
[im 4/36]
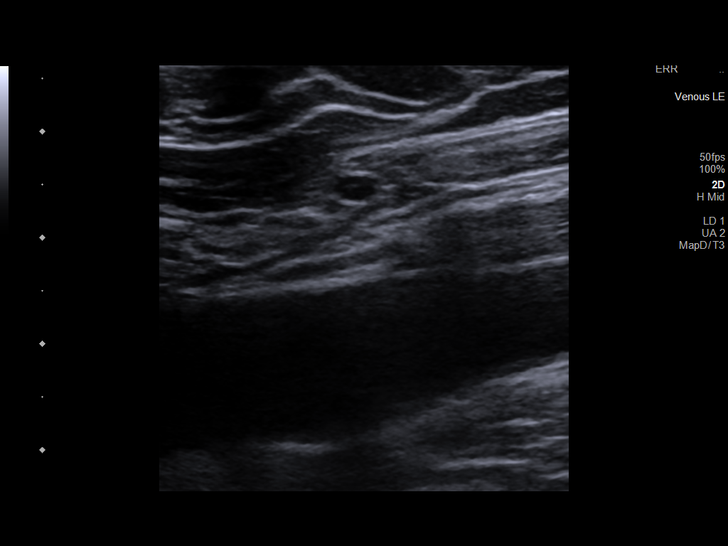
[im 7/36]
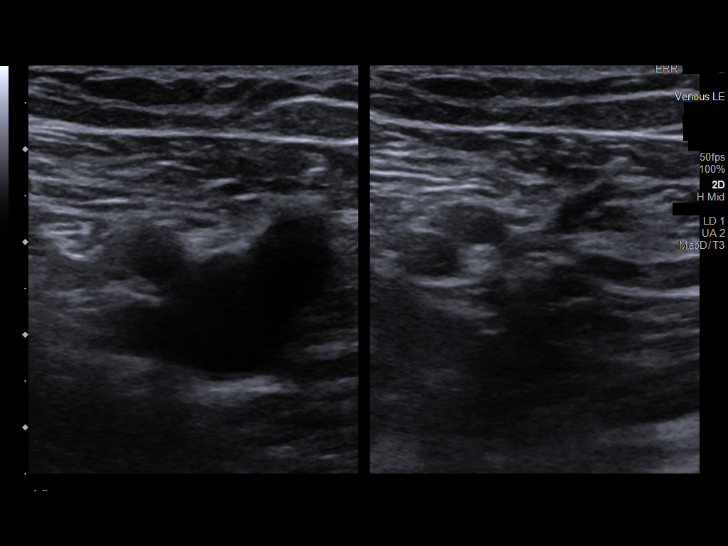
[im 10/36]
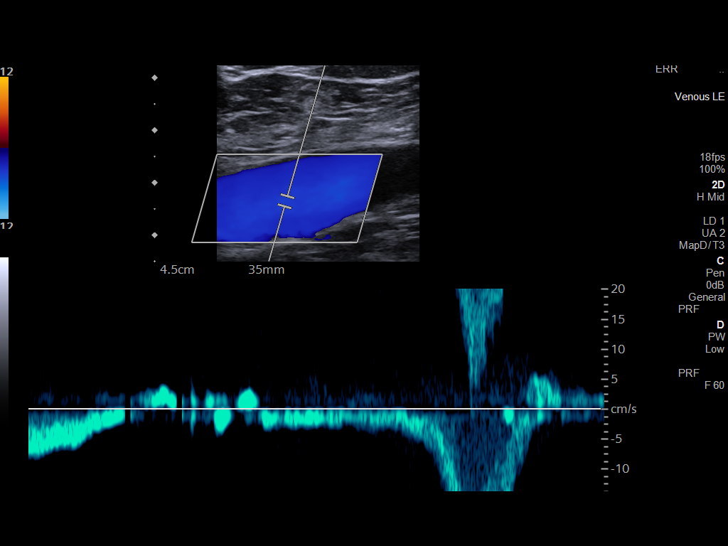
[im 13/36]
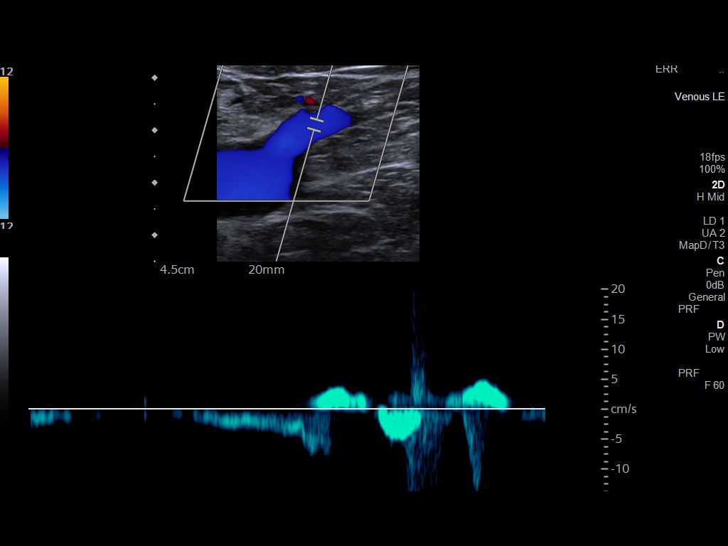
[im 16/36]
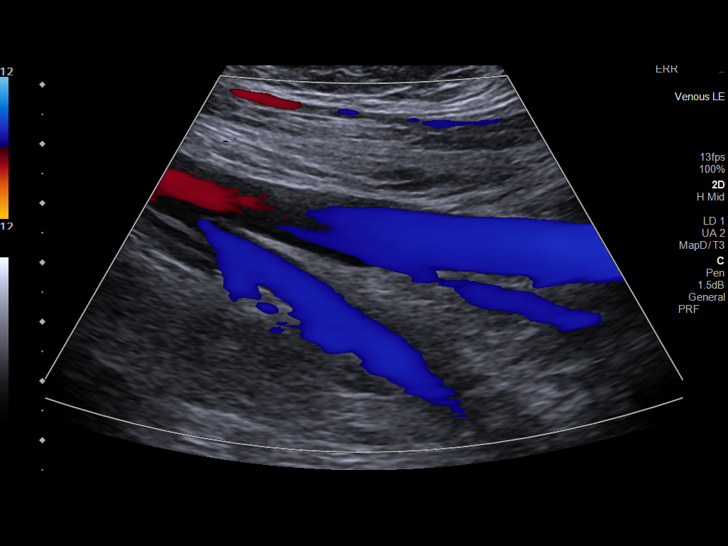
[im 19/36]
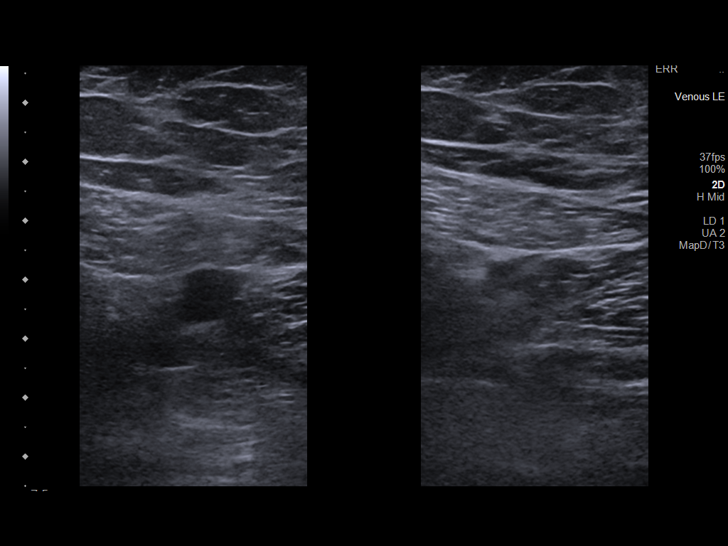
[im 20/36]
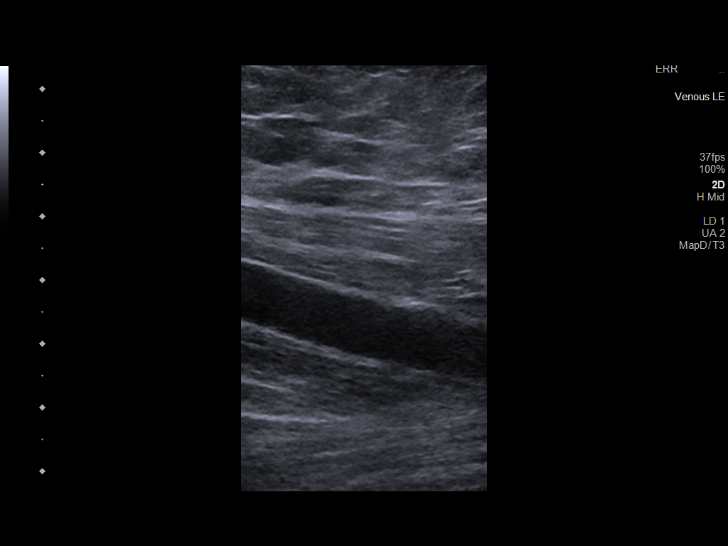
[im 23/36]
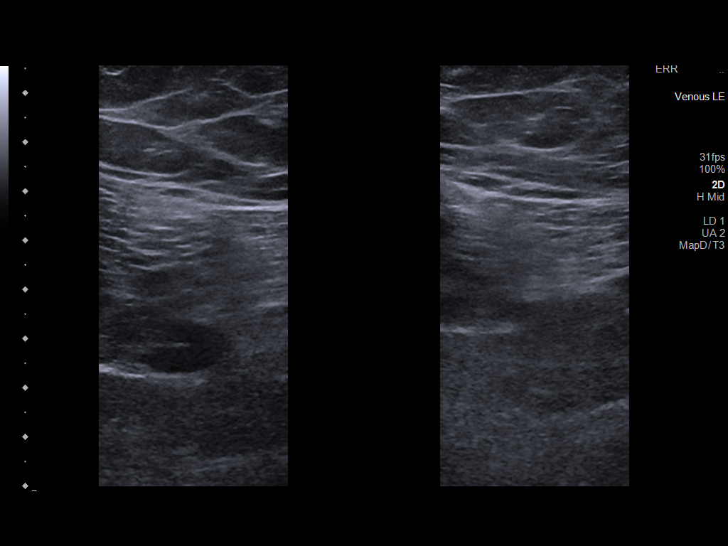
[im 26/36]
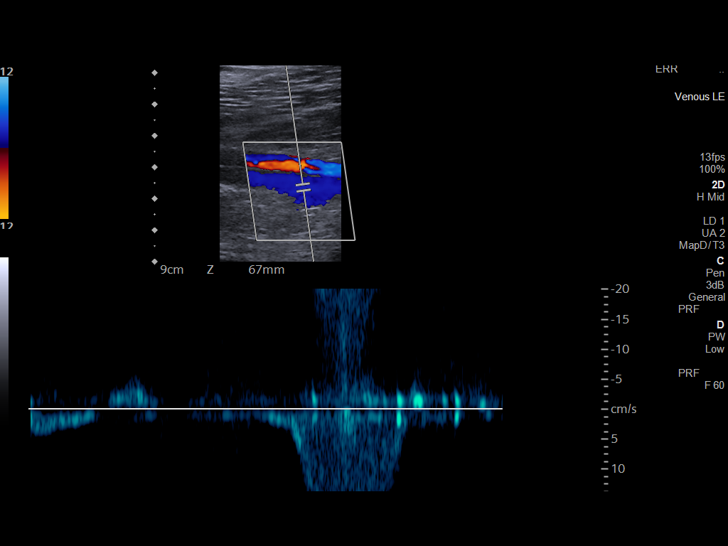
[im 29/36]
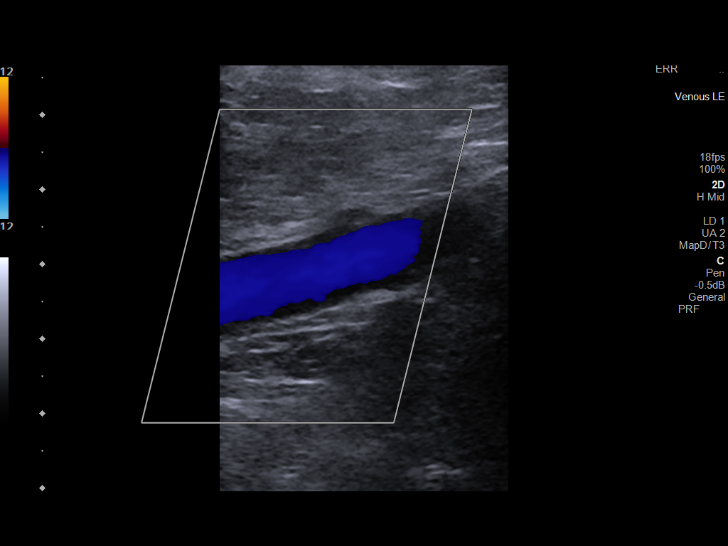
[im 32/36]
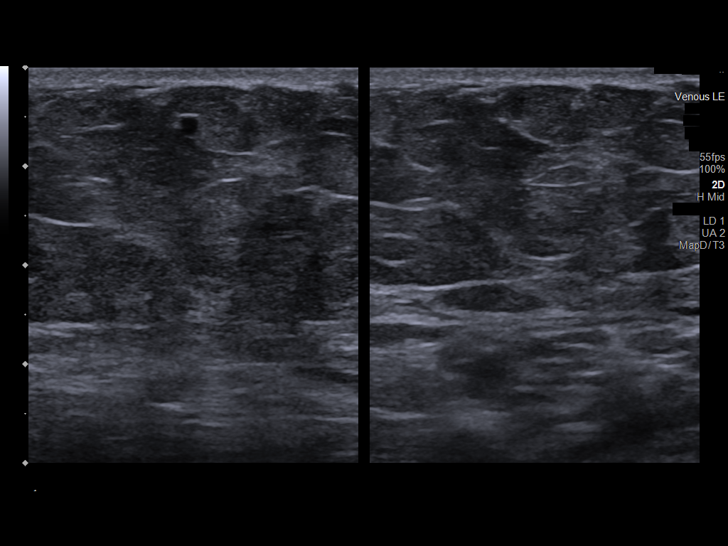
[im 36/36]
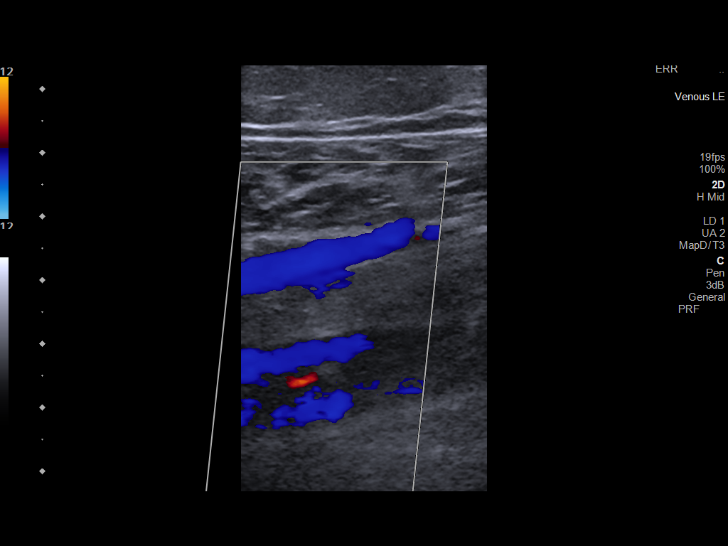

[13 of 24 positions shown; findings below may reference images not displayed]

FINDINGS: Contralateral Common Femoral Vein: Respiratory phasicity is normal
and symmetric with the symptomatic side. No evidence of thrombus.
Normal compressibility.

Common Femoral Vein: No evidence of thrombus. Normal
compressibility, respiratory phasicity and response to augmentation.

Saphenofemoral Junction: No evidence of thrombus. Normal
compressibility and flow on color Doppler imaging.

Profunda Femoral Vein: No evidence of thrombus. Normal
compressibility and flow on color Doppler imaging.

Femoral Vein: No evidence of thrombus. Normal compressibility,
respiratory phasicity and response to augmentation.

Popliteal Vein: No evidence of thrombus. Normal compressibility,
respiratory phasicity and response to augmentation.

Calf Veins: Posterior tibial vein patent and compressible. Peroneal
vein not visualized.

Superficial Great Saphenous Vein: No evidence of thrombus. Normal
compressibility and flow on color Doppler imaging.

Other Findings:  None.
IMPRESSION: Sonographic survey of the right lower extremity negative for DVT

## 2023-12-20 ENCOUNTER — Encounter (HOSPITAL_COMMUNITY): Payer: Self-pay

## 2023-12-20 ENCOUNTER — Emergency Department (HOSPITAL_COMMUNITY)
Admission: EM | Admit: 2023-12-20 | Discharge: 2023-12-20 | Disposition: A | Discharge status: Home / Self Care | Attending: Emergency Medicine | Admitting: Emergency Medicine

## 2023-12-20 ENCOUNTER — Other Ambulatory Visit: Payer: Self-pay

## 2023-12-20 DIAGNOSIS — R309 Painful micturition, unspecified: Secondary | ICD-10-CM | POA: Diagnosis not present

## 2023-12-20 DIAGNOSIS — R300 Dysuria: Secondary | ICD-10-CM | POA: Diagnosis not present

## 2023-12-20 DIAGNOSIS — N898 Other specified noninflammatory disorders of vagina: Secondary | ICD-10-CM | POA: Insufficient documentation

## 2023-12-20 HISTORY — DX: Bronchitis, not specified as acute or chronic: J40

## 2023-12-20 HISTORY — DX: Acute vaginitis: B96.89

## 2023-12-20 LAB — WET PREP, GENITAL
Clue Cells Wet Prep HPF POC: NONE SEEN
Sperm: NONE SEEN
Trich, Wet Prep: NONE SEEN
WBC, Wet Prep HPF POC: <10 (ref <10)
Yeast Wet Prep HPF POC: NONE SEEN

## 2023-12-20 LAB — URINALYSIS, ROUTINE W REFLEX MICROSCOPIC
Bacteria, UA: NONE SEEN
Bilirubin Urine: NEGATIVE
Glucose, UA: NEGATIVE mg/dL
Ketones, ur: NEGATIVE mg/dL
Leukocytes,Ua: NEGATIVE
Nitrite: NEGATIVE
Protein, ur: NEGATIVE mg/dL
Specific Gravity, Urine: 1.02 (ref 1.005–1.030)
pH: 6 (ref 5.0–8.0)

## 2023-12-20 LAB — PREGNANCY, URINE: Preg Test, Ur: NEGATIVE

## 2023-12-20 NOTE — ED Provider Notes (Signed)
 [place] EMERGENCY DEPARTMENT AT [facility] Provider Note   CSN: [id number] Arrival date & time: 12/20/23  1432     History  Chief Complaint  Patient presents with   Dysuria    Tina Gross is a 33 y.o. female.   Dysuria Associated symptoms: no abdominal pain, no fever, no flank pain, no nausea, no vaginal discharge and no vomiting        Tina Gross is a 33 y.o. female who presents to the Emergency Department complaining of burning with urination for 1 day.  Recently changed brands of soap that she is using.  Notes burning only occurs when urinating.  She denies any abdominal, pelvic or flank pain, fever or chills, nausea or vomiting.  Recent unprotected intercourse with your partner.  Currently on her menstrual cycle, but denies any abnormal bleeding or abnormal vaginal discharge.  No history of kidney stones.  Reports recurrent episodes of BV infections and is concerned that she may have a UTI or another BV infection.    Home Medications Prior to Admission medications   Medication Sig Start Date End Date Taking? Authorizing Provider  azithromycin  (ZITHROMAX ) 250 MG tablet Take 1 tablet (250 mg total) by mouth daily. Take first 2 tablets together, then 1 every day until finished. 12/04/20   Zackowski, Scott, MD  cyclobenzaprine (FLEXERIL) 5 MG tablet Take by mouth. 01/15/18   [provider]  diphenhydrAMINE  (BENADRYL ) 25 MG tablet Take 1 tablet (25 mg total) by mouth every 6 (six) hours as needed for itching. 12/04/20   Zackowski, Scott, MD  meloxicam  (MOBIC ) 15 MG tablet Take 1 tablet (15 mg total) by mouth daily. 11/18/20   Eldon Greenland, MD  omeprazole (PRILOSEC) 40 MG capsule Take 1 capsule by mouth daily. 10/18/20   [provider]      Allergies    Dilaudid [hydromorphone], Ciprofloxacin , and Keflex [cephalexin]    Review of Systems   Review of Systems  Constitutional:  Negative for appetite change, chills and fever.   Respiratory:  Negative for shortness of breath.   Gastrointestinal:  Negative for abdominal pain, diarrhea, nausea and vomiting.  Genitourinary:  Positive for dysuria. Negative for decreased urine volume, difficulty urinating, flank pain, frequency, pelvic pain, vaginal bleeding and vaginal discharge.  Musculoskeletal:  Negative for back pain.  Neurological:  Negative for dizziness, weakness and headaches.    Physical Exam Updated Vital Signs BP 135/80   Pulse 71   Temp 97.6 F (36.4 C) (Temporal)   Resp 18   Ht 5' 7 (1.702 m)   Wt 120.2 kg   LMP 12/20/2023 (Exact Date)   SpO2 98%   BMI 41.50 kg/m  Physical Exam Vitals and nursing note reviewed.  Constitutional:      General: She is not in acute distress.    Appearance: Normal appearance. She is not ill-appearing or toxic-appearing.  Cardiovascular:     Rate and Rhythm: Normal rate and regular rhythm.     Pulses: Normal pulses.  Pulmonary:     Effort: Pulmonary effort is normal.     Breath sounds: Normal breath sounds.  Abdominal:     Palpations: Abdomen is soft.     Tenderness: There is no abdominal tenderness. There is no right CVA tenderness or left CVA tenderness.  Musculoskeletal:        General: Normal range of motion.  Skin:    General: Skin is warm.     Capillary Refill: Capillary refill takes less than 2  seconds.  Neurological:     General: No focal deficit present.     Mental Status: She is alert.     Sensory: No sensory deficit.     Motor: No weakness.     ED Results / Procedures / Treatments   Labs (all labs ordered are listed, but only abnormal results are displayed) Labs Reviewed  URINE CULTURE - Abnormal; Notable for the following components:      Result Value   Culture MULTIPLE SPECIES PRESENT, SUGGEST RECOLLECTION (*)    All other components within normal limits  URINALYSIS, ROUTINE W REFLEX MICROSCOPIC - Abnormal; Notable for the following components:   Hgb urine dipstick LARGE (*)    All  other components within normal limits  WET PREP, GENITAL  PREGNANCY, URINE  GC/CHLAMYDIA PROBE AMP () NOT AT [facility]    EKG None  Radiology No results found.  Procedures Procedures    Medications Ordered in ED Medications - No data to display  ED Course/ Medical Decision Making/ A&P                                 Medical Decision Making Patient here with reports of burning with urination for 1 day.  Symptoms only occur when urinating.  Denies any urinary frequency, hesitancy or malodorous urine.  No abdominal or pelvic pain or flank pain.  No history of kidney stones.  Currently has menstrual cycle, denies any abnormal vaginal bleeding or vaginal discharge.  She does note recent change to her daily soap.  Patient well-appearing on exam.  No abdominal pain on exam, no CVA tenderness.  She denies traumatic intercourse and burning only occurs with urination.  Possible UTI. vaginal irritation, BV, STI also considered in differential  Amount and/or Complexity of Data Reviewed Labs: ordered.    Details: Labs show urinalysis with large blood otherwise no evidence of infection, wet prep unremarkable, pregnancy test negative Discussion of management or test interpretation with external provider(s): Urinalysis without evidence of infection wet prep without evidence of BV.  Symptoms possibly related to vaginal irritation secondary to use of a different soap.  GC and Chlamydia tests are pending.  Overall patient well-appearing nontoxic-appearing.  No abdominal pain or flank pain.  No suprapubic tenderness on my exam.  Doubt emergent process, patient agreeable to switch back to her old brand of soap           Final Clinical Impression(s) / ED Diagnoses Final diagnoses:  Vaginal irritation    Rx / DC Orders ED Discharge Orders     None         Mitzie Anda 12/24/23 1332    Cheyenne Cotta, MD 01/05/24 1200

## 2023-12-20 NOTE — Discharge Instructions (Signed)
 Stop using your current soap and go back to the soap you were previously using.  Do not douche.  Follow-up with your primary care provider or your OB/GYN for recheck if needed.  If your symptoms worsen or you develop new symptoms, please return to the ER

## 2023-12-20 NOTE — ED Triage Notes (Signed)
 Pt arrived via POV c/o dysuria since yesterday. Pt reports she is also currently on her menstrual cycle. Pt reports Hx of BV.

## 2023-12-21 LAB — GC/CHLAMYDIA PROBE AMP (~~LOC~~) NOT AT ARMC
Chlamydia: NEGATIVE
Comment: NEGATIVE
Comment: NORMAL
Neisseria Gonorrhea: NEGATIVE

## 2023-12-22 LAB — URINE CULTURE
